# Patient Record
Sex: Female | Born: 1955 | Race: Black or African American | Hispanic: No | Marital: Single
Health system: Southern US, Community
[De-identification: ages and names within clinical notes are randomized; demographics above are authoritative.]

---

## 2013-02-18 ENCOUNTER — Emergency Department: Payer: Self-pay | Admitting: Emergency Medicine

## 2013-02-21 ENCOUNTER — Ambulatory Visit: Payer: Self-pay

## 2013-02-25 ENCOUNTER — Ambulatory Visit: Payer: Self-pay

## 2013-06-18 ENCOUNTER — Inpatient Hospital Stay: Payer: Self-pay | Admitting: Internal Medicine

## 2013-06-18 LAB — COMPREHENSIVE METABOLIC PANEL
Alkaline Phosphatase: 114 U/L (ref 50–136)
Anion Gap: 6 — ABNORMAL LOW (ref 7–16)
BUN: 9 mg/dL (ref 7–18)
Bilirubin,Total: 0.4 mg/dL (ref 0.2–1.0)
Calcium, Total: 9 mg/dL (ref 8.5–10.1)
Chloride: 105 mmol/L (ref 98–107)
Co2: 29 mmol/L (ref 21–32)
Creatinine: 0.79 mg/dL (ref 0.60–1.30)
EGFR (African American): >60
Osmolality: 281 (ref 275–301)
Potassium: 3.3 mmol/L — ABNORMAL LOW (ref 3.5–5.1)
Sodium: 140 mmol/L (ref 136–145)

## 2013-06-18 LAB — URINALYSIS, COMPLETE
Bilirubin,UR: NEGATIVE
Ketone: NEGATIVE
Leukocyte Esterase: NEGATIVE
Nitrite: NEGATIVE
Protein: >=500
RBC,UR: 1 /HPF (ref 0–5)
Specific Gravity: 1.01 (ref 1.003–1.030)
Squamous Epithelial: NONE SEEN

## 2013-06-18 LAB — CK TOTAL AND CKMB (NOT AT ARMC)
CK, Total: 253 U/L — ABNORMAL HIGH (ref 21–215)
CK-MB: 4.9 ng/mL — ABNORMAL HIGH (ref 0.5–3.6)

## 2013-06-18 LAB — CBC
HCT: 50.6 % — ABNORMAL HIGH (ref 35.0–47.0)
MCH: 28.9 pg (ref 26.0–34.0)
MCHC: 33.4 g/dL (ref 32.0–36.0)
Platelet: 259 10*3/uL (ref 150–440)
RDW: 14.7 % — ABNORMAL HIGH (ref 11.5–14.5)
WBC: 6.3 10*3/uL (ref 3.6–11.0)

## 2013-06-18 LAB — PROTIME-INR
INR: 1
Prothrombin Time: 12.9 secs (ref 11.5–14.7)

## 2013-06-18 LAB — MAGNESIUM: Magnesium: 1.8 mg/dL

## 2013-06-18 LAB — PRO B NATRIURETIC PEPTIDE: B-Type Natriuretic Peptide: 192 pg/mL — ABNORMAL HIGH (ref 0–125)

## 2013-06-19 LAB — COMPREHENSIVE METABOLIC PANEL
Albumin: 3.4 g/dL (ref 3.4–5.0)
Alkaline Phosphatase: 100 U/L (ref 50–136)
Anion Gap: 7 (ref 7–16)
BUN: 11 mg/dL (ref 7–18)
Bilirubin,Total: 0.4 mg/dL (ref 0.2–1.0)
Calcium, Total: 9.4 mg/dL (ref 8.5–10.1)
Chloride: 103 mmol/L (ref 98–107)
Co2: 29 mmol/L (ref 21–32)
Creatinine: 0.87 mg/dL (ref 0.60–1.30)
EGFR (African American): >60
EGFR (Non-African Amer.): >60
Osmolality: 281 (ref 275–301)
Potassium: 4.1 mmol/L (ref 3.5–5.1)
SGOT(AST): 22 U/L (ref 15–37)
SGPT (ALT): 16 U/L (ref 12–78)
Sodium: 139 mmol/L (ref 136–145)
Total Protein: 7.1 g/dL (ref 6.4–8.2)

## 2013-06-19 LAB — CBC WITH DIFFERENTIAL/PLATELET
Eosinophil #: 0 10*3/uL (ref 0.0–0.7)
Eosinophil %: 0 %
HCT: 47.9 % — ABNORMAL HIGH (ref 35.0–47.0)
Lymphocyte #: 1.4 10*3/uL (ref 1.0–3.6)
MCV: 88 fL (ref 80–100)
Monocyte %: 0.5 %
Neutrophil #: 6.4 10*3/uL (ref 1.4–6.5)
Platelet: 248 10*3/uL (ref 150–440)
RBC: 5.46 10*6/uL — ABNORMAL HIGH (ref 3.80–5.20)
WBC: 7.9 10*3/uL (ref 3.6–11.0)

## 2013-06-19 LAB — LIPID PANEL: Cholesterol: 237 mg/dL — ABNORMAL HIGH (ref 0–200)

## 2013-11-06 IMAGING — CR DG CHEST 1V PORT
1 series · 1 of 1 positions shown · non-contrast
Comparison: none

REASON FOR EXAM: Chest pain, dyspnea
COMMENTS:

[ap]
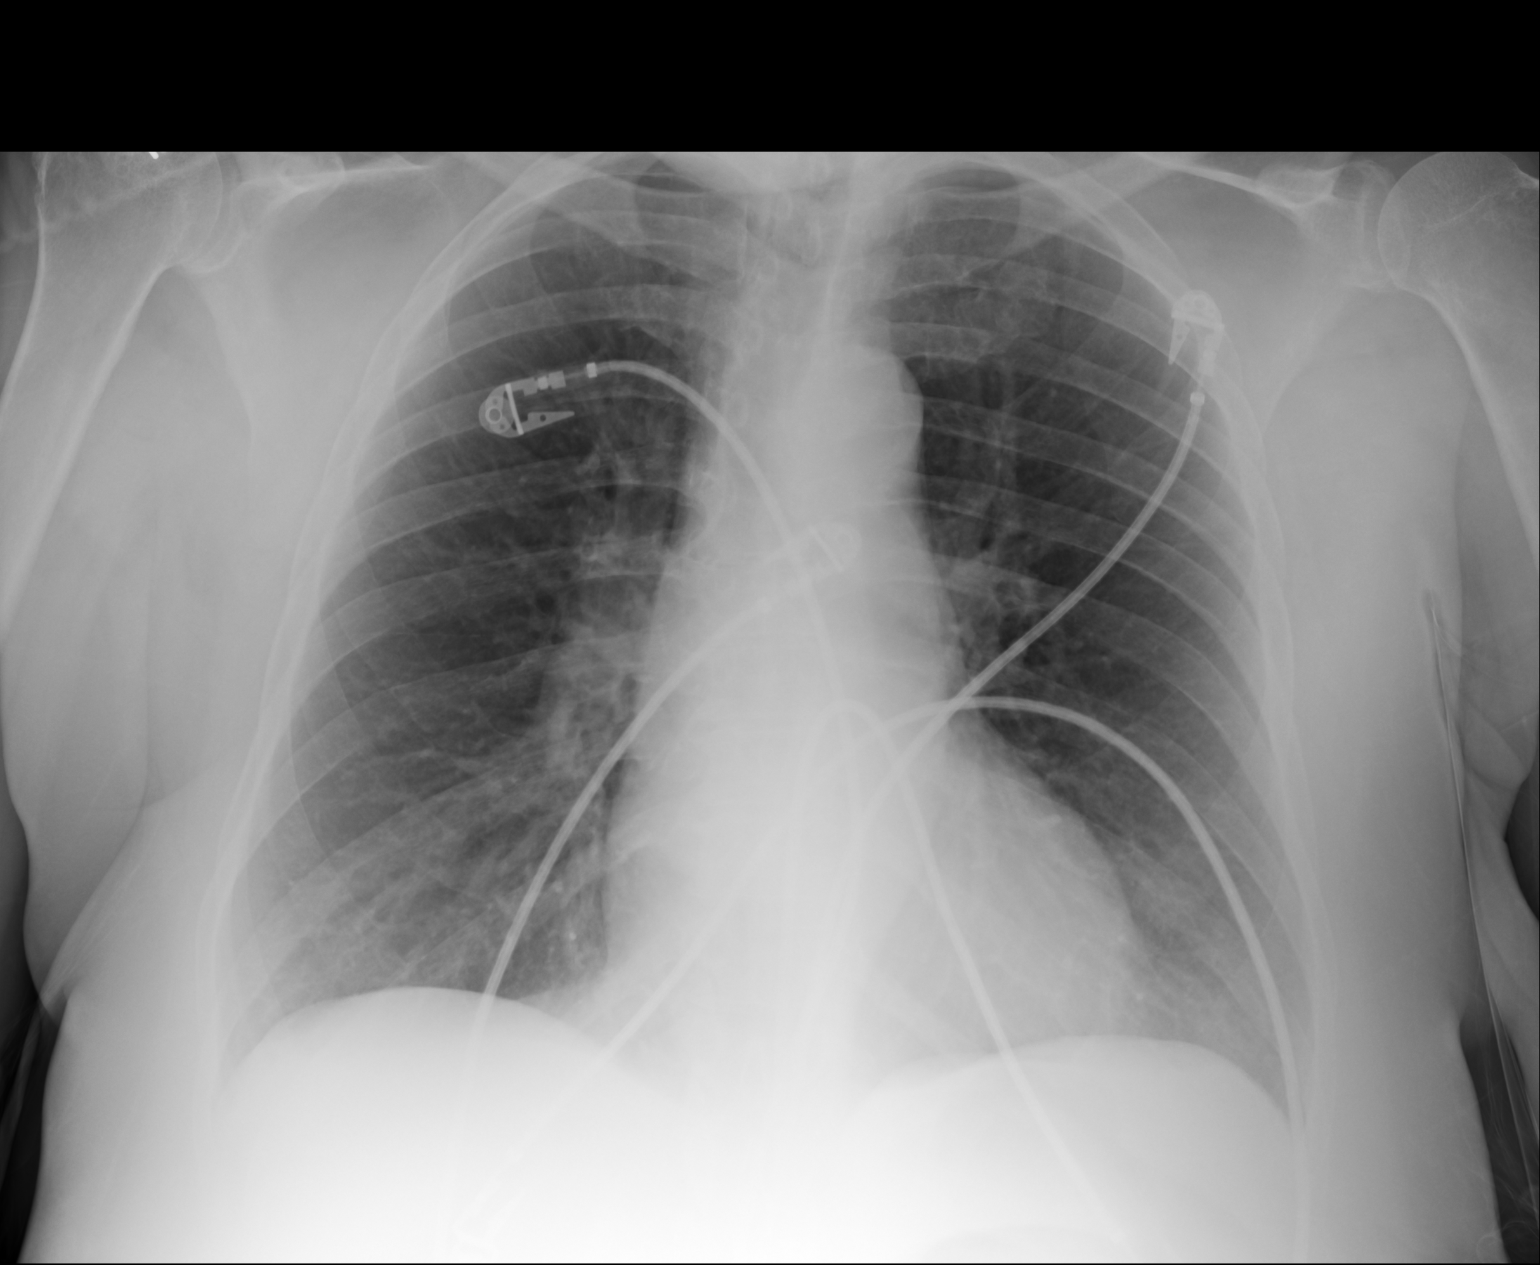

[1 of 1 positions shown; findings below may reference images not displayed]

PROCEDURE:     DXR - DXR PORTABLE CHEST SINGLE VIEW  - June 18, 2013  [DATE]

RESULT:     The lungs are adequately inflated and clear. The cardiac
silhouette is normal in size. The mediastinum is normal in width. The
central pulmonary vascularity is minimally prominent. There is no pleural
effusion or pneumothorax.
IMPRESSION: There is no evidence of pneumonia nor pulmonary edema.
There may be minimal pulmonary vascular congestion however. A followup PA
and lateral chest x-ray would be of value.

[REDACTED]

## 2015-04-10 NOTE — H&P (Signed)
PATIENT NAME:  Colleen Reed, Colleen Reed MR#:  [id number] DATE OF BIRTH:  [birth year]  DATE OF ADMISSION:  06/18/2013  CHIEF COMPLAINT: Shortness of breath and elevation of blood pressure.   PRIMARY CARE PHYSICIAN: None.   HISTORY OF PRESENT ILLNESS: Ms. Colleen Reed is a very nice 59 year old female who has history of uncontrolled diabetes, hypertension. She is disabled due to neck problems and she has severe COPD. She is a current smoker and she smokes up to 1 pack a day. The patient states that during the past 2 weeks she has been increasing in shortness of breath, cough and sputum. The patient states that for the past 5 days she has been using her nebulizer more than 5 times a day, which is not normal for her, and has not been able to leave the house for long periods of time because of increased shortness of breath. Especially when she goes out and is hot, the shortness of breath gets worse. The patient went to take her friend today to an ophthalmology visit and there, she started to have increased shortness of breath. She asked for the people on the desk to call the EMS. EMS arrived. Her blood pressure was 260/150, for what she was brought straight to the ER. At that moment, she was having significant shortness of breath. She was put on oxygen 2 liters nasal cannula and she was just barely oxygenating at 91% on 3 liters. For that reason, she was put on BiPAP. The patient had significant chills and feeling really hot last night, but she has not had any documented fever. At this moment, I was able to remove her from the BiPAP and the patient is oxygenating 95% on 5 liters nasal cannula. The patient is admitted for evaluation and treatment of this condition.   REVIEW OF SYSTEMS:    CONSTITUTIONAL: Denies any fever, but she has been feeling happened hot and cold on and off every day. Positive fatigue and weakness due to her shortness of breath. No significant weight loss or weight gain.  EYES: No blurry vision, double vision  or changes in vision.  EARS, NOSE, THROAT: No tinnitus. No difficulty swallowing. No postnasal drip. No sore throat.  RESPIRATORY: Positive cough. Positive wheezing. Negative hemoptysis. Positive dyspnea. Positive COPD. Negative painful respirations. Negative pneumonia in the past.  CARDIOVASCULAR: No chest pain, orthopnea, edema, arrhythmias, syncope or palpitations.  GASTROINTESTINAL: No nausea, vomiting, abdominal pain, constipation, diarrhea, jaundice or melena.  GENITOURINARY: No dysuria, hematuria or changes in urinary frequency.  GYNECOLOGIC: No breast masses.  ENDOCRINE: No polyuria, polydipsia, polyphagia, cold or heat intolerance. She has not been taking any medications for her blood pressure or diabetes for over 5 months because she states, "I cannot afford it."  HEMATOLOGIC AND LYMPHATIC: No anemia, easy bruising, bleeding or swollen glands.  SKIN: No rashes, petechiae or new or changing moles.  MUSCULOSKELETAL: Positive neck pain. Negative gout. Negative swelling of joints.  NEUROLOGIC: No numbness, tingling, ataxia, CVA or TIA.  PSYCHIATRIC: No significant insomnia, depression or anxiety.   PAST MEDICAL HISTORY: 1.  COPD.  2.  Diabetes type 2, not on medications.  3.  Hypertension, not on medications.  4.  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  5.  DJD of the neck.   ALLERGIES: THE PATIENT IS ALLERGIC TO ROBAXIN, GAVE HER A RASH.   PAST SURGICAL HISTORY: Neck surgery due to DJD.   FAMILY HISTORY: Negative for MI. Positive for AAA in her mother and brother; they died from that. Negative  for cancer.   SOCIAL HISTORY: Positive for smoking, 1 pack a day for over 35 to 40 years. Alcohol: Denies any significant alcohol use. She drinks a beer once or twice a month. She lives with her husband and she is on disability.   CURRENT MEDICATIONS: None, as the patient cannot afford them. She does not remember which medications she was taking before.   PHYSICAL EXAMINATION: VITAL SIGNS: Blood  pressure of 175/85 here at the ER. Oxygen saturation was 91% on over 2 liters of oxygen. The patient was put on BiPAP. There is no recorded oxygen saturation on room air. Her pulse was 118. Respirations were 28. Temperature 98.5.  GENERAL: The patient is alert. She is on BiPAP. We were able to take her off the BiPAP and she is feeling a little bit better, more comfortable. She is able to talk and is keeping up with her oxygen saturation. She is not in any significant distress at this moment, but she was earlier.  HEENT: Pupils are equal and reactive. Extraocular movements are intact. Mucosae are moist. Anicteric sclerae. Pink conjunctivae. No oral lesions. No oropharyngeal exudates. Normocephalic, atraumatic. No nasal lesions.  NECK: Supple, obese. No JVD. No thyromegaly. No adenopathy. No carotid bruits. No rigidity.  CARDIOVASCULAR: Regular rate and rhythm, tachycardic. No murmurs, rubs or gallops. No displacement of PMI. No tenderness to palpation of anterior chest wall.  LUNGS: Showing some wheezing bilaterally and some rhonchi. The patient has use of accessory muscles when she talks for too long, but she is keeping up with her oxygen saturation. No dullness to percussion.  ABDOMEN: Soft, nontender, nondistended. No hepatosplenomegaly. No masses. Bowel sounds are positive.  EXTREMITIES: No edema, cyanosis or clubbing. VASCULAR: Pulses +2. Capillary refill less than 3.  LYMPHATIC: Negative for lymphadenopathy in neck or supraclavicular areas.  MUSCULOSKELETAL: No significant joint abnormalities or joint effusions. No deformity.  SKIN: No rashes or petechiae.  NEUROLOGIC: Cranial nerves II through XII intact. Strength is 5/5 in 4 extremities. Sensation is normal distally.  PSYCHIATRIC: Negative for agitation. The patient is alert, oriented x 3.  LABORATORY, DIAGNOSTIC AND RADIOLOGICAL DATA: BNP 192, glucose 152, creatinine 0.79, sodium 140, potassium 3.3, other electrolytes within normal limits.  LFTs are normal. Total CK is 253; CK-MB is 4.9, troponin 0.02. TSH is 0.55. White blood cells 6.3, hemoglobin 16, hematocrit 50, platelet count 259. INR 1.0.   Chest x-ray: No evidence of pneumonia or pulmonary edema. There are signs of COPD.   EKG: Sinus tachycardia. No ST depression or elevation.   ASSESSMENT AND PLAN: A 59 year old female admitted with a history of chronic obstructive pulmonary disease, diabetes, hypertension, disabled, comes with increased shortness of breath and hypertension emergency.  1.  Acute respiratory failure: The patient has significant decrease of oxygen. I do not have an oxygen saturation from EMS, but the patient was 91 here on over 2 liters of oxygen, for what she was put on BiPAP. She is oxygenating very well now after several respiratory treatments. We will continue to monitor, keep providing oxygen nasal cannula for now and as-needed BiPAP.  2.  Chronic obstructive pulmonary disease exacerbation: We are going to provide antibiotic treatment with Levaquin. We are going to put the patient on steroid cycle every 6 hours, nebulizers. We are going to add on inhalers. We are going to add on Spiriva. The patient needs to be compliant with treatment and start taking her medications.  3.  Hypertension emergency: The patient has been off medications for over  5 months. She states she cannot afford it, but she smokes 1 pack a day. Smoking cessation has been recommended highly for the patient. We spoke for over 5 minutes for this. The patient is going to be started on medications that are cheap, amlodipine and hydrochlorothiazide for now, and as-needed intravenous medications. She might be able to afford these medications, as they are on the 4 dollar list at [facility].  4.  Diabetes: Insulin sliding scale and add on metformin, which is also cheap. We are going to monitor her blood sugars and put her on a diabetic diet.  5.  Deep vein thrombosis prophylaxis with heparin.  Gastrointestinal prophylaxis with Protonix, as the patient is on steroids now.   CODE STATUS: The patient is a full code.   TIME SPENT: I spent about 50 minutes with this patient.    ____________________________ Felipa Furnace, MD [email or web address] D: 06/18/2013 18:00:35 ET T: 06/18/2013 18:20:31 ET JOB#: [id number]  cc: Felipa Furnace, MD, <Dictator> Paydon Carll Juanda Chance MD ELECTRONICALLY SIGNED 06/19/2013 15:16

## 2015-04-10 NOTE — Discharge Summary (Signed)
PATIENT NAME:  Colleen MaladyCREW, Lyrah M MR#:  [id number] DATE OF BIRTH:  31-May-1956  DATE OF ADMISSION:  06/18/2013 DATE OF DISCHARGE:  06/21/2013  ADMITTING DIAGNOSES: 1.  Shortness of breath. 2.  Elevated blood pressure.   DISCHARGE DIAGNOSES: 1.  Shortness of breath due to acute on chronic obstructive pulmonary disease exacerbation.  2.  Accelerated hypertension.  3.  Diabetes.  4.  Disabled due to neck injury. 5.  Degenerative joint disease of the neck, status post C-spine surgery.   CONSULTANTS: None.  PERTINENT LABORATORIES AND EVALUATIONS:  Admitting glucose 152. BNP was 192, BUN 9, creatinine 0.79, sodium 140, potassium 3.3, chloride 105, CO2 was 29. Her magnesium was 1.8. LFTs were normal. CPK 253, CK-MB 4.9. Troponin less than 0.02. TSH 0.556. WBC 6.3, hemoglobin 16.9, platelet count 259. INR 1.0. UA was nitrite negative, leukocytes negative.  EKG showed sinus tachycardia with possible left atrial enlargement.  Chest x-ray showed no evidence of pneumonia or pulmonary edema.   HOSPITAL COURSE: Please refer to H and P done by the admitting physician. The patient is a 59 year old African American female with history of COPD, diabetes, hypertension, who presented with shortness of breath, elevated blood pressure. The patient has had problems with getting her medications due to cost. She initially came to the ED, had a blood pressure of 260/150 and she was also having significant shortness of breath, had to be initially replaced on BiPAP, subsequently she was on 5 liters of O2. The patient had a chest x-ray that was negative. She was thought to have acute COPD exacerbation. She was treated with nebulizers, steroids and antibiotics. With these treatments, she started feeling much better. Her shortness of breath improved. The patient also had accelerated blood pressure that improved with management. At this time, she is doing much better and is stable for discharge.   DISCHARGE MEDICATIONS: 1.   Metformin 1500 mg once in a.m., 1000 mg in the evening. 2.  Loratadine 10 daily. 3.  Prednisone taper 60 mg, taper by 10 mg until complete. 4.  Hydrochlorothiazide/lisinopril 25/20 one tab p.o. daily. 5.  Albuterol, ipratropium q. 6 p.r.n. 6.  Fluticasone nasal spray 2 sprays to each nostril daily. 7.  Advair Diskus 250/50 one puff b.i.d. 8.  Combivent 1 puff 4 times a day as needed for shortness of breath.   DIET:  Low sodium, low fat, low cholesterol, carbohydrate-controlled diet.   ACTIVITY:  As tolerated.  Follow up with primary MD.  If none, [facility]  Note:  35 minutes spent on the discharge.    ____________________________ Lacie ScottsShreyang H. Allena KatzPatel, MD shp:ce D: 06/22/2013 08:14:18 ET T: 06/22/2013 09:32:24 ET JOB#: [id number]  cc: Alesandro Stueve H. Allena KatzPatel, MD, <Dictator> Charise CarwinSHREYANG H Nehal Witting MD ELECTRONICALLY SIGNED 06/28/2013 14:46

## 2018-02-16 DEATH — deceased
